# Patient Record
Sex: Female | Born: 1985 | Race: White | Hispanic: No | Marital: Single | Smoking: Never smoker
Health system: Southern US, Community
[De-identification: ages and names within clinical notes are randomized; demographics above are authoritative.]

---
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
## 2008-02-10 ENCOUNTER — Emergency Department (HOSPITAL_BASED_OUTPATIENT_CLINIC_OR_DEPARTMENT_OTHER): Admission: EM | Admit: 2008-02-10 | Discharge: 2008-02-10 | Payer: Self-pay | Admitting: Emergency Medicine

## 2008-02-17 ENCOUNTER — Ambulatory Visit (HOSPITAL_COMMUNITY): Admission: AD | Admit: 2008-02-17 | Discharge: 2008-02-17 | Payer: Self-pay | Admitting: Urology

## 2008-04-04 ENCOUNTER — Emergency Department (HOSPITAL_BASED_OUTPATIENT_CLINIC_OR_DEPARTMENT_OTHER): Admission: EM | Admit: 2008-04-04 | Discharge: 2008-04-04 | Payer: Self-pay | Admitting: Emergency Medicine

## 2009-03-28 IMAGING — CT CT ABDOMEN W/O CM
2 of 4 series · 16 of 46 positions shown, 18 images · non-contrast
Comparison: None

CT ABDOMEN

CLINICAL DATA: Flank pain.  Nausea.  Vomiting.  Assess for stone
or infection

CT ABDOMEN AND PELVIS WITHOUT CONTRAST
TECHNIQUE: Multidetector CT imaging of the abdomen and pelvis was
performed following the standard protocol without intravenous
contrast.

[Series 2: renal stone < 200 lbs 5.0 b31f · axial · 0.66mm/px · z∈[-420,-50]mm · 13 of 85 slices shown, 15 images]
[im 7/85  soft-tissue]
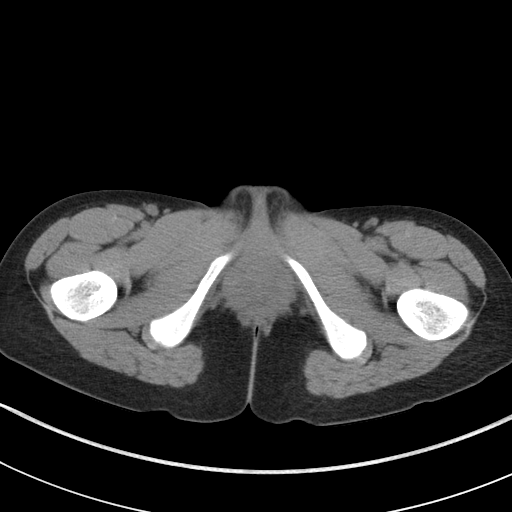
[im 7/85  bone]
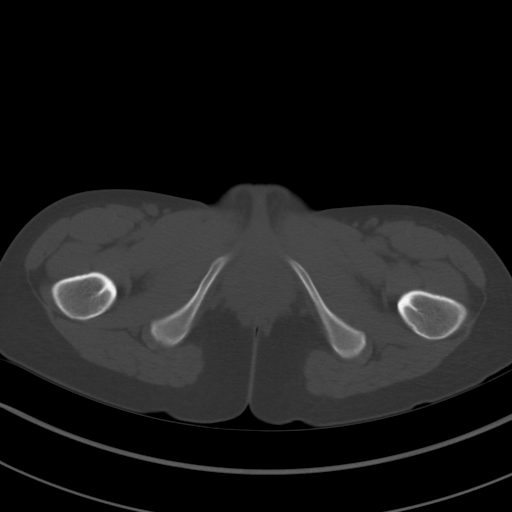
[im 13/85  soft-tissue]
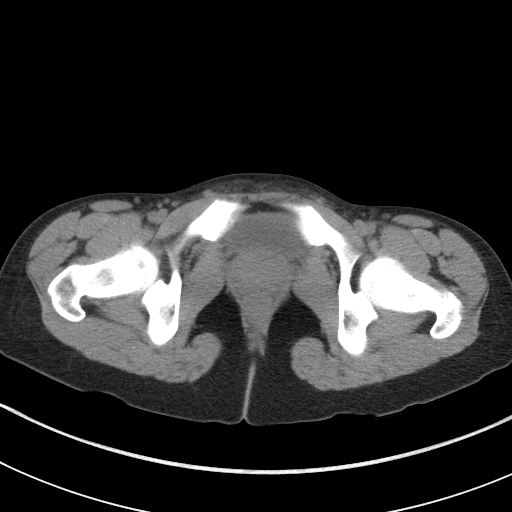
[im 19/85  soft-tissue]
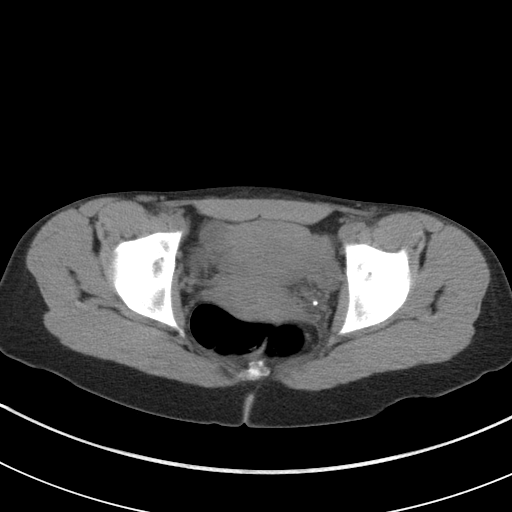
[im 25/85  soft-tissue]
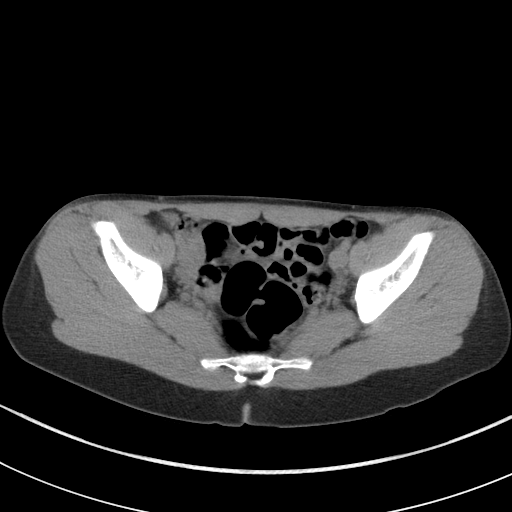
[im 32/85  soft-tissue]
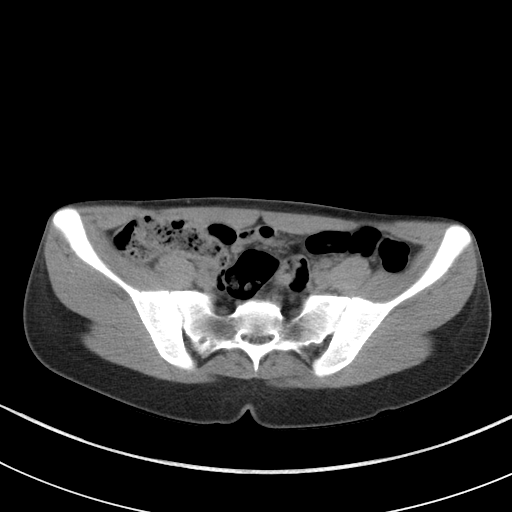
[im 38/85  soft-tissue]
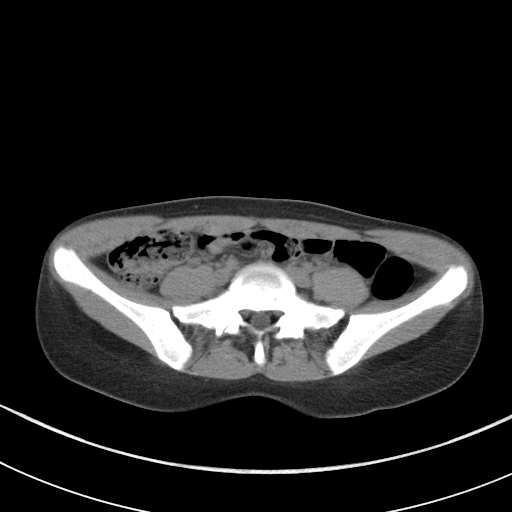
[im 44/85  soft-tissue]
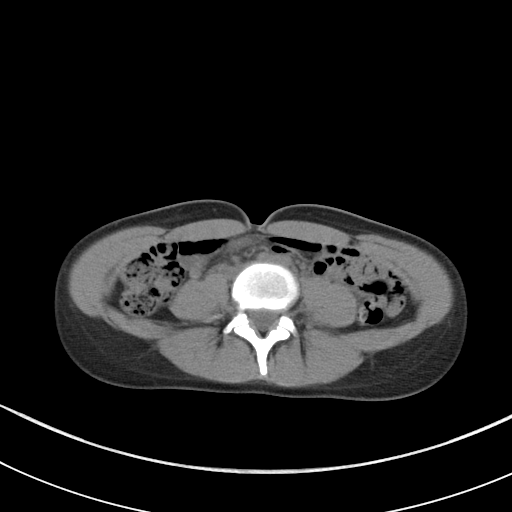
[im 50/85  soft-tissue]
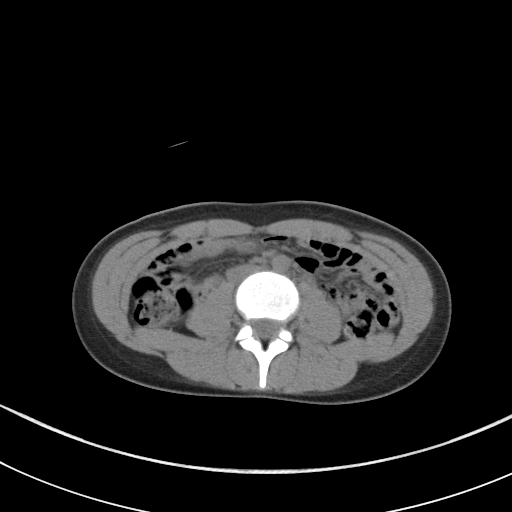
[im 57/85  soft-tissue]
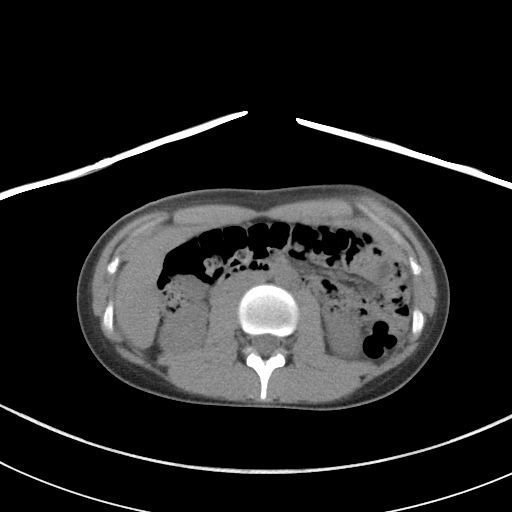
[im 57/85  bone]
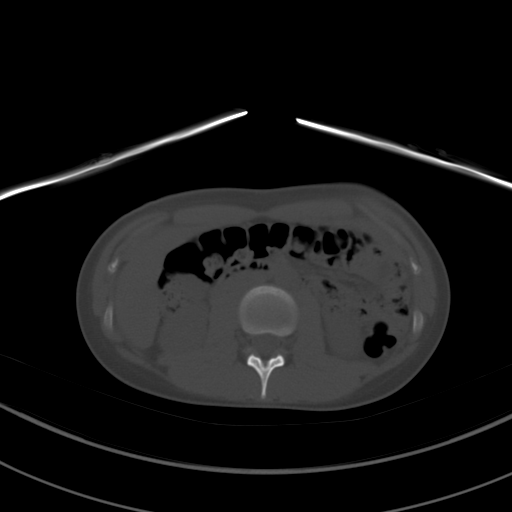
[im 63/85  soft-tissue]
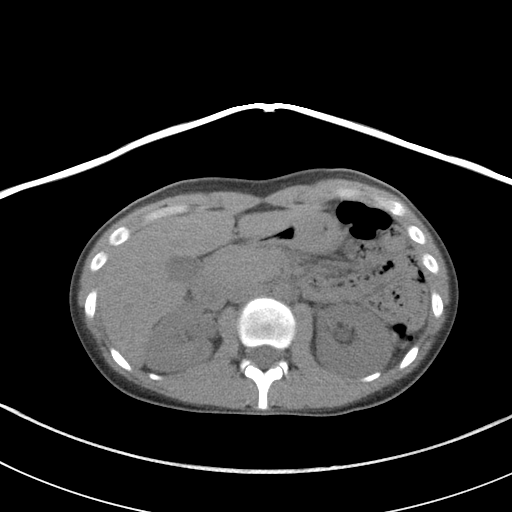
[im 69/85  soft-tissue]
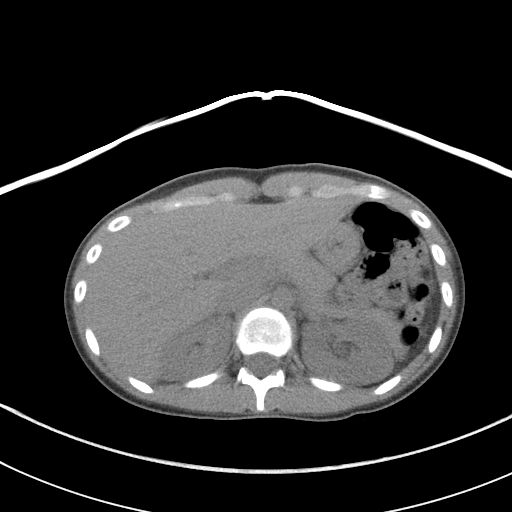
[im 75/85  soft-tissue]
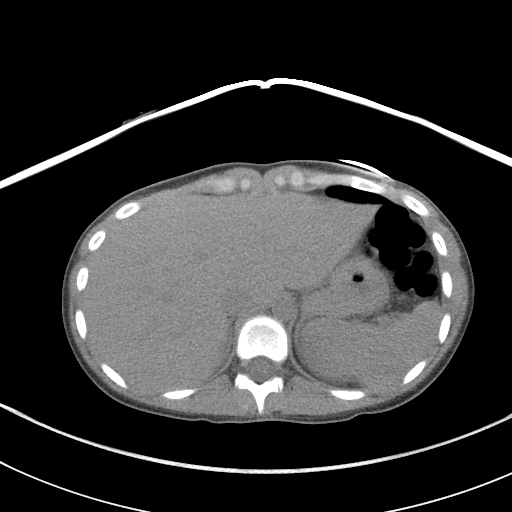
[im 81/85  soft-tissue]
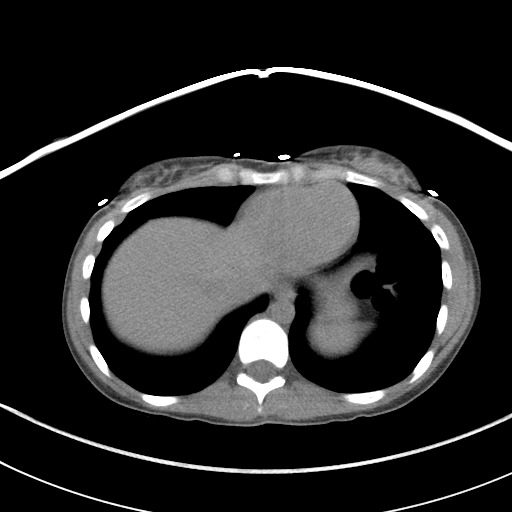

[Series 3: renal stone 2.0 coronal · coronal · 0.61mm/px · 3 of 77 slices shown]
[im 26/77  soft-tissue]
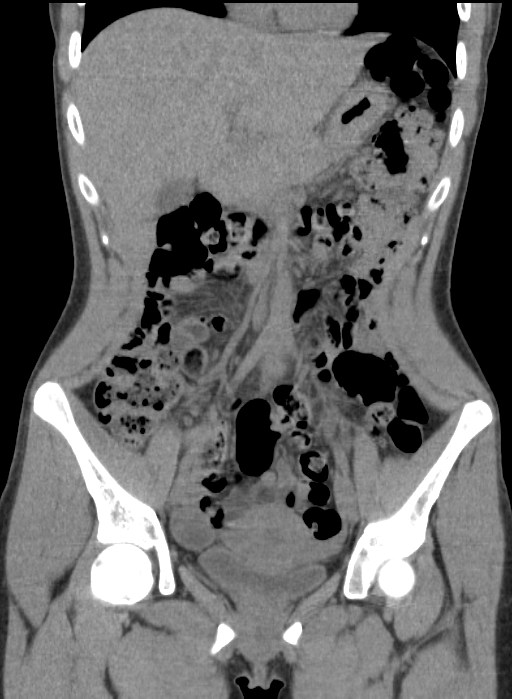
[im 34/77  soft-tissue]
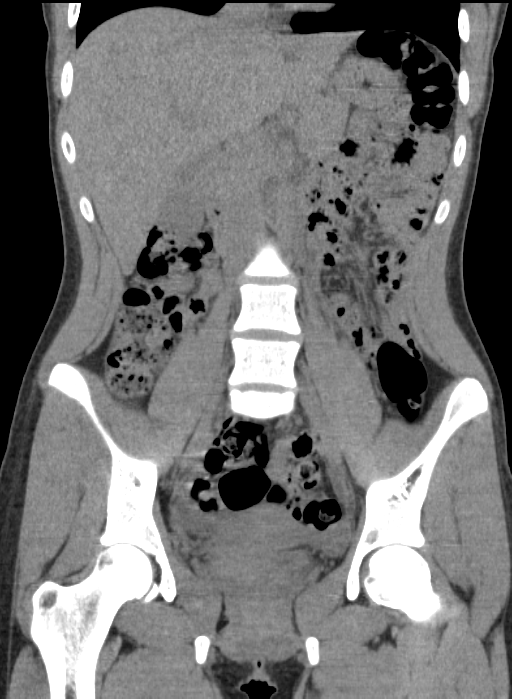
[im 43/77  soft-tissue]
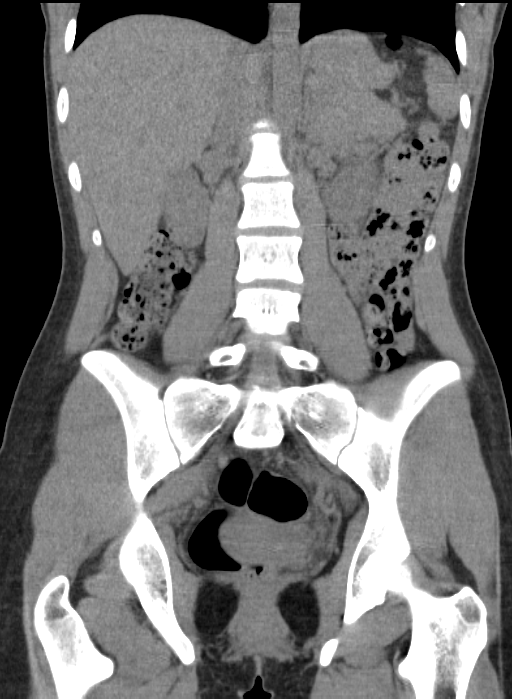

[16 of 46 positions shown; findings below may reference images not displayed]

FINDINGS: Lung bases are clear.  The liver, gallbladder, spleen,
pancreas and adrenal glands appear normal without contrast.  No
free fluid or air.  No bowel pathology is seen.  Right kidney is
normal in size shape and position.  No hydronephrosis.  Left kidney
is slightly swollen compared to the right to and shows mild
hydroureteronephrosis.  No obstructing lesion is not seen in the
abdominal portion of the scan.
IMPRESSION: Slight swelling of the left kidney and mild hydroureteronephrosis
without obstructing lesion being evident in the abdominal portion
of the scan.

CT PELVIS
FINDINGS: The uterus and adnexal regions appear normal.  No free
fluid.  There is a 4 mm calcification in the left pelvis that I
think probably represents a distal ureteral stone.  It is
conceivable this could represent a phlebolith but I think the
former is more likely.  No other stone.
IMPRESSION: 4mm calcification in the left pelvis suspicious for distal ureteral
stone.  I cannot be absolutely certain of this.  It is possible
this could be a phlebolith.  Location would be two centimeters
proximal to the UVJ.

## 2010-08-26 NOTE — Op Note (Signed)
NAMESHAENA, Joy Blair             ACCOUNT NO.:  [id number]   MEDICAL RECORD NO.:  [id number]          PATIENT TYPE:  AMB   LOCATION:  DAY                          FACILITY:  [facility]   PHYSICIAN:  Excell Seltzer. Annabell Howells, M.D.    DATE OF BIRTH:  [birth year]   DATE OF PROCEDURE:  02/17/2008  DATE OF DISCHARGE:                               OPERATIVE REPORT   PROCEDURE:  Cystoscopy with stone manipulation.   PREOPERATIVE DIAGNOSIS:  Left distal ureteral stone.   POSTOPERATIVE DIAGNOSIS:  Left distal ureteral stone.   SURGEON:  Bjorn Pippin, MD   ANESTHESIA:  General.   SPECIMENS:  Stone.   COMPLICATIONS:  None.   INDICATIONS:  Joy Blair is a 25 year old white female with a 3-4 mm left  distal ureteral stone who is having persistent symptoms and elected to  proceed with ureteroscopic therapy.   FINDINGS AND PROCEDURE:  The patient was taken to the operating room,  Cipro was given, general anesthetic was induced.  She was placed in  lithotomy position.  Her perineum and genitalia were prepped with  Betadine solution.  She was draped in the usual sterile fashion.  A  formal time-out was completed.  Cystoscopy was performed with a 22  [place] scope and 12 degree lens.  Examination revealed a normal urethra.  The bladder wall was smooth and pale without tumor or inflammation.  The  right ureteral orifice was unremarkable.  At the left ureteral orifice  the stone was crowning.   The stone was then manipulated with the beak of the cystoscope into the  bladder without difficulty and then removed from the bladder.   The 6 short ureteroscope was passed easily up the distal 4 cm of ureter  to make sure no other stones or abnormalities were noted and none were  seen.   The bladder was drained.  The patient was taken down from lithotomy  position.  Her anesthetic was reversed.  She was removed to recovery in  stable condition.  There were no complications.     Excell Seltzer. Annabell Howells, M.D.  Electronically  Signed    JJW/MEDQ  D:  02/17/2008  T:  02/18/2008  Job:  [id number]

## 2011-01-13 LAB — URINALYSIS, ROUTINE W REFLEX MICROSCOPIC
Glucose, UA: NEGATIVE
Ketones, ur: 15 — AB
Ketones, ur: NEGATIVE
Nitrite: NEGATIVE
Protein, ur: NEGATIVE
Specific Gravity, Urine: 1.023
Specific Gravity, Urine: 1.029
Urobilinogen, UA: 0.2
pH: 6
pH: 6.5

## 2011-01-13 LAB — DIFFERENTIAL
Eosinophils Relative: 1
Lymphocytes Relative: 31
Monocytes Absolute: 0.6
Monocytes Relative: 7
Neutrophils Relative %: 61

## 2011-01-13 LAB — BASIC METABOLIC PANEL
BUN: 12
Calcium: 10
GFR calc non Af Amer: >60
Potassium: 4.3

## 2011-01-13 LAB — ACETAMINOPHEN LEVEL: Acetaminophen (Tylenol), Serum: <10 — ABNORMAL LOW

## 2011-01-13 LAB — CBC
MCV: 83
RBC: 4.86
RDW: 13.2

## 2011-01-13 LAB — URINE MICROSCOPIC-ADD ON

## 2011-01-13 LAB — PREGNANCY, URINE: Preg Test, Ur: NEGATIVE

## 2011-01-13 LAB — URINE CULTURE

## 2012-09-09 DIAGNOSIS — R799 Abnormal finding of blood chemistry, unspecified: Secondary | ICD-10-CM | POA: Insufficient documentation

## 2013-12-11 ENCOUNTER — Emergency Department (HOSPITAL_BASED_OUTPATIENT_CLINIC_OR_DEPARTMENT_OTHER)
Admission: EM | Admit: 2013-12-11 | Discharge: 2013-12-11 | Disposition: A | Discharge status: Home / Self Care | Payer: BC Managed Care – PPO | Attending: Emergency Medicine | Admitting: Emergency Medicine

## 2013-12-11 ENCOUNTER — Encounter (HOSPITAL_BASED_OUTPATIENT_CLINIC_OR_DEPARTMENT_OTHER): Payer: Self-pay | Admitting: Emergency Medicine

## 2013-12-11 DIAGNOSIS — R109 Unspecified abdominal pain: Secondary | ICD-10-CM | POA: Diagnosis not present

## 2013-12-11 DIAGNOSIS — Z3202 Encounter for pregnancy test, result negative: Secondary | ICD-10-CM | POA: Insufficient documentation

## 2013-12-11 DIAGNOSIS — R110 Nausea: Secondary | ICD-10-CM | POA: Diagnosis not present

## 2013-12-11 LAB — URINALYSIS, ROUTINE W REFLEX MICROSCOPIC
BILIRUBIN URINE: NEGATIVE
GLUCOSE, UA: NEGATIVE mg/dL
KETONES UR: NEGATIVE mg/dL
Leukocytes, UA: NEGATIVE
Nitrite: NEGATIVE
PROTEIN: NEGATIVE mg/dL
SPECIFIC GRAVITY, URINE: 1.019 (ref 1.005–1.030)
Urobilinogen, UA: 0.2 mg/dL (ref 0.0–1.0)
pH: 5.5 (ref 5.0–8.0)

## 2013-12-11 LAB — URINE MICROSCOPIC-ADD ON

## 2013-12-11 LAB — PREGNANCY, URINE: PREG TEST UR: NEGATIVE

## 2013-12-11 MED ORDER — ONDANSETRON HCL 4 MG PO TABS: 4.0000 mg | ORAL_TABLET | Freq: Four times a day (QID) | ORAL | Status: DC

## 2013-12-11 MED ORDER — HYDROCODONE-ACETAMINOPHEN 5-325 MG PO TABS: 1.0000 | ORAL_TABLET | ORAL | Status: DC | PRN

## 2013-12-11 NOTE — ED Notes (Signed)
Right flank pain on and off since this am. Hx of kidney stones.

## 2013-12-11 NOTE — ED Provider Notes (Signed)
CSN: [id number]     Arrival date & time 12/11/13  1535 History   First MD Initiated Contact with Patient 12/11/13 1601     Chief Complaint  Patient presents with  . Flank Pain     (Consider location/radiation/quality/duration/timing/severity/associated sxs/prior Treatment) Patient is a 28 y.o. female presenting with flank pain. The history is provided by the patient. No language interpreter was used.  Flank Pain This is a new problem. The current episode started today. Associated symptoms include nausea. Pertinent negatives include no abdominal pain, chills, fever or vomiting. Associated symptoms comments: Right flank pain since this morning causing nausea without vomiting. No fever. She reports that since arrival here her pain has completely resolved. She has had kidney stones once in the past that required intervention. No fever, dysuria, hematuria. No abdominal pain.Marland Kitchen    History reviewed. No pertinent past medical history. History reviewed. No pertinent past surgical history. No family history on file. History  Substance Use Topics  . Smoking status: Never Smoker   . Smokeless tobacco: Not on file  . Alcohol Use: No   OB History   Grav Para Term Preterm Abortions TAB SAB Ect Mult Living                 Review of Systems  Constitutional: Negative for fever and chills.  Respiratory: Negative.   Cardiovascular: Negative.   Gastrointestinal: Positive for nausea. Negative for vomiting and abdominal pain.  Genitourinary: Positive for flank pain. Negative for dysuria, hematuria and vaginal discharge.  Musculoskeletal: Negative.   Neurological: Negative.       Allergies  Review of patient's allergies indicates no known allergies.  Home Medications   Prior to Admission medications   Not on File   BP 130/88  Pulse 99  Temp(Src) 97.7 F (36.5 C) (Oral)  Resp 16  Ht  (1.6 m)  Wt 140 lb (63.504 kg)  BMI 24.81 kg/m2  SpO2 100%  LMP 12/06/2013 Physical Exam   Constitutional: She is oriented to person, place, and time. She appears well-developed and well-nourished.  Neck: Normal range of motion.  Pulmonary/Chest: Effort normal.  Abdominal: There is no tenderness.  Genitourinary:  No CVA tenderness.   Musculoskeletal: Normal range of motion.  Neurological: She is alert and oriented to person, place, and time.  Skin: Skin is warm and dry.  Psychiatric: She has a normal mood and affect.    ED Course  Procedures (including critical care time) Labs Review Labs Reviewed  URINALYSIS, ROUTINE W REFLEX MICROSCOPIC - Abnormal; Notable for the following:    Hgb urine dipstick LARGE (*)    All other components within normal limits  PREGNANCY, URINE  URINE MICROSCOPIC-ADD ON   Results for orders placed during the hospital encounter of 12/11/13  URINALYSIS, ROUTINE W REFLEX MICROSCOPIC      Result Value Ref Range   Color, Urine YELLOW  YELLOW   APPearance CLEAR  CLEAR   Specific Gravity, Urine 1.019  1.005 - 1.030   pH 5.5  5.0 - 8.0   Glucose, UA NEGATIVE  NEGATIVE mg/dL   Hgb urine dipstick LARGE (*) NEGATIVE   Bilirubin Urine NEGATIVE  NEGATIVE   Ketones, ur NEGATIVE  NEGATIVE mg/dL   Protein, ur NEGATIVE  NEGATIVE mg/dL   Urobilinogen, UA 0.2  0.0 - 1.0 mg/dL   Nitrite NEGATIVE  NEGATIVE   Leukocytes, UA NEGATIVE  NEGATIVE  PREGNANCY, URINE      Result Value Ref Range   Preg Test, Ur NEGATIVE  NEGATIVE  URINE MICROSCOPIC-ADD ON      Result Value Ref Range   Squamous Epithelial / LPF RARE  RARE   RBC / HPF 21-50  <3 RBC/hpf   Bacteria, UA RARE  RARE     Imaging Review No results found.   EKG Interpretation None      MDM   Final diagnoses:  None    1. Flank pain  No further pain in ED on re-evaluation. Suspect, with hematuria, history of stones and resolved pain that is it is likely she passed a kidney stone. Discussed return precautions with the patient. Pain and nausea medication Rx's provided which patient will fill  if she has recurrent symptoms.    Arnoldo Hooker, PA-C 12/11/13 [phone]

## 2013-12-11 NOTE — Discharge Instructions (Signed)
Flank Pain °Flank pain refers to pain that is located on the side of the body between the upper abdomen and the back. The pain may occur over a short period of time (acute) or may be long-term or reoccurring (chronic). It may be mild or severe. Flank pain can be caused by many things. °CAUSES  °Some of the more common causes of flank pain include: °· Muscle strains.   °· Muscle spasms.   °· A disease of your spine (vertebral disk disease).   °· A lung infection (pneumonia).   °· Fluid around your lungs (pulmonary edema).   °· A kidney infection.   °· Kidney stones.   °· A very painful skin rash caused by the chickenpox virus (shingles).   °· Gallbladder disease.   °HOME CARE INSTRUCTIONS  °Home care will depend on the cause of your pain. In general, °· Rest as directed by your caregiver. °· Drink enough fluids to keep your urine clear or pale yellow. °· Only take over-the-counter or prescription medicines as directed by your caregiver. Some medicines may help relieve the pain. °· Tell your caregiver about any changes in your pain. °· Follow up with your caregiver as directed. °SEEK IMMEDIATE MEDICAL CARE IF:  °· Your pain is not controlled with medicine.   °· You have new or worsening symptoms. °· Your pain increases.   °· You have abdominal pain.   °· You have shortness of breath.   °· You have persistent nausea or vomiting.   °· You have swelling in your abdomen.   °· You feel faint or pass out.   °· You have blood in your urine. °· You have a fever or persistent symptoms for more than 2-3 days. °· You have a fever and your symptoms suddenly get worse. °MAKE SURE YOU:  °· Understand these instructions. °· Will watch your condition. °· Will get help right away if you are not doing well or get worse. °Document Released: 05/21/2005 Document Revised: 12/23/2011 Document Reviewed: 11/12/2011 °ExitCare® Patient Information ©2015 [employer]. This information is not intended to replace advice given to you by your  health care provider. Make sure you discuss any questions you have with your health care provider. ° °

## 2013-12-13 NOTE — ED Provider Notes (Signed)
Medical screening examination/treatment/procedure(s) were performed by non-physician practitioner and as supervising physician I was immediately available for consultation/collaboration.    Kaimana Lurz L Gurshan Settlemire, MD 12/13/13 [phone] 

## 2014-11-07 ENCOUNTER — Encounter (HOSPITAL_BASED_OUTPATIENT_CLINIC_OR_DEPARTMENT_OTHER): Payer: Self-pay

## 2014-11-07 ENCOUNTER — Emergency Department (HOSPITAL_BASED_OUTPATIENT_CLINIC_OR_DEPARTMENT_OTHER)
Admission: EM | Admit: 2014-11-07 | Discharge: 2014-11-07 | Disposition: A | Discharge status: Home / Self Care | Payer: BLUE CROSS/BLUE SHIELD | Attending: Emergency Medicine | Admitting: Emergency Medicine

## 2014-11-07 ENCOUNTER — Emergency Department (HOSPITAL_BASED_OUTPATIENT_CLINIC_OR_DEPARTMENT_OTHER): Payer: BLUE CROSS/BLUE SHIELD

## 2014-11-07 DIAGNOSIS — Y9289 Other specified places as the place of occurrence of the external cause: Secondary | ICD-10-CM | POA: Diagnosis not present

## 2014-11-07 DIAGNOSIS — S93401A Sprain of unspecified ligament of right ankle, initial encounter: Secondary | ICD-10-CM | POA: Diagnosis not present

## 2014-11-07 DIAGNOSIS — X58XXXA Exposure to other specified factors, initial encounter: Secondary | ICD-10-CM | POA: Insufficient documentation

## 2014-11-07 DIAGNOSIS — Y998 Other external cause status: Secondary | ICD-10-CM | POA: Insufficient documentation

## 2014-11-07 DIAGNOSIS — Y93A1 Activity, exercise machines primarily for cardiorespiratory conditioning: Secondary | ICD-10-CM | POA: Insufficient documentation

## 2014-11-07 DIAGNOSIS — S99911A Unspecified injury of right ankle, initial encounter: Secondary | ICD-10-CM | POA: Diagnosis present

## 2014-11-07 NOTE — Discharge Instructions (Signed)
You may take ibuprofen or naproxen (NSAIDs) for pain and swelling.  Ankle Sprain An ankle sprain is an injury to the strong, fibrous tissues (ligaments) that hold the bones of your ankle joint together.  CAUSES An ankle sprain is usually caused by a fall or by twisting your ankle. Ankle sprains most commonly occur when you step on the outer edge of your foot, and your ankle turns inward. People who participate in sports are more prone to these types of injuries.  SYMPTOMS   Pain in your ankle. The pain may be present at rest or only when you are trying to stand or walk.  Swelling.  Bruising. Bruising may develop immediately or within 1 to 2 days after your injury.  Difficulty standing or walking, particularly when turning corners or changing directions. DIAGNOSIS  Your caregiver will ask you details about your injury and perform a physical exam of your ankle to determine if you have an ankle sprain. During the physical exam, your caregiver will press on and apply pressure to specific areas of your foot and ankle. Your caregiver will try to move your ankle in certain ways. An X-ray exam may be done to be sure a bone was not broken or a ligament did not separate from one of the bones in your ankle (avulsion fracture).  TREATMENT  Certain types of braces can help stabilize your ankle. Your caregiver can make a recommendation for this. Your caregiver may recommend the use of medicine for pain. If your sprain is severe, your caregiver may refer you to a surgeon who helps to restore function to parts of your skeletal system (orthopedist) or a physical therapist. HOME CARE INSTRUCTIONS   Apply ice to your injury for 1-2 days or as directed by your caregiver. Applying ice helps to reduce inflammation and pain.  Put ice in a plastic bag.  Place a towel between your skin and the bag.  Leave the ice on for 15-20 minutes at a time, every 2 hours while you are awake.  Only take over-the-counter or  prescription medicines for pain, discomfort, or fever as directed by your caregiver.  Elevate your injured ankle above the level of your heart as much as possible for 2-3 days.  If your caregiver recommends crutches, use them as instructed. Gradually put weight on the affected ankle. Continue to use crutches or a cane until you can walk without feeling pain in your ankle.  If you have a plaster splint, wear the splint as directed by your caregiver. Do not rest it on anything harder than a pillow for the first 24 hours. Do not put weight on it. Do not get it wet. You may take it off to take a shower or bath.  You may have been given an elastic bandage to wear around your ankle to provide support. If the elastic bandage is too tight (you have numbness or tingling in your foot or your foot becomes cold and blue), adjust the bandage to make it comfortable.  If you have an air splint, you may blow more air into it or let air out to make it more comfortable. You may take your splint off at night and before taking a shower or bath. Wiggle your toes in the splint several times per day to decrease swelling. SEEK MEDICAL CARE IF:   You have rapidly increasing bruising or swelling.  Your toes feel extremely cold or you lose feeling in your foot.  Your pain is not relieved with medicine.  SEEK IMMEDIATE MEDICAL CARE IF:  Your toes are numb or blue.  You have severe pain that is increasing. MAKE SURE YOU:   Understand these instructions.  Will watch your condition.  Will get help right away if you are not doing well or get worse. Document Released: 03/30/2005 Document Revised: 12/23/2011 Document Reviewed: 04/11/2011 [facility] Patient Information 2015 [place]ExitCare, [place]LLC. This information is not intended to replace advice given to you by your health care provider. Make sure you discuss any questions you have with your health care provider. RICE: Routine Care for Injuries The routine care of many  injuries includes Rest, Ice, Compression, and Elevation (RICE). HOME CARE INSTRUCTIONS  Rest is needed to allow your body to heal. Routine activities can usually be resumed when comfortable. Injured tendons and bones can take up to 6 weeks to heal. Tendons are the cord-like structures that attach muscle to bone.  Ice following an injury helps keep the swelling down and reduces pain.  Put ice in a plastic bag.  Place a towel between your skin and the bag.  Leave the ice on for 15-20 minutes, 3-4 times a day, or as directed by your health care provider. Do this while awake, for the first 24 to 48 hours. After that, continue as directed by your caregiver.  Compression helps keep swelling down. It also gives support and helps with discomfort. If an elastic bandage has been applied, it should be removed and reapplied every 3 to 4 hours. It should not be applied tightly, but firmly enough to keep swelling down. Watch fingers or toes for swelling, bluish discoloration, coldness, numbness, or excessive pain. If any of these problems occur, remove the bandage and reapply loosely. Contact your caregiver if these problems continue.  Elevation helps reduce swelling and decreases pain. With extremities, such as the arms, hands, legs, and feet, the injured area should be placed near or above the level of the heart, if possible. SEEK IMMEDIATE MEDICAL CARE IF:  You have persistent pain and swelling.  You develop redness, numbness, or unexpected weakness.  Your symptoms are getting worse rather than improving after several days. These symptoms may indicate that further evaluation or further X-rays are needed. Sometimes, X-rays may not show a small broken bone (fracture) until 1 week or 10 days later. Make a follow-up appointment with your caregiver. Ask when your X-ray results will be ready. Make sure you get your X-ray results. Document Released: 07/12/2000 Document Revised: 04/04/2013 Document Reviewed:  08/29/2010 [facility] ClearforkExitCare Patient Information 2015 [place]ExitCare, [place]LLC. This information is not intended to replace advice given to you by your health care provider. Make sure you discuss any questions you have with your health care provider.

## 2014-11-07 NOTE — ED Notes (Signed)
Right ankle injury today

## 2014-11-07 NOTE — ED Provider Notes (Signed)
CSN: [id number]     Arrival date & time 11/07/14  1351 History   First MD Initiated Contact with Patient 11/07/14 1352     Chief Complaint  Patient presents with  . Ankle Injury     (Consider location/radiation/quality/duration/timing/severity/associated sxs/prior Treatment) HPI Comments: 29 y/o F c/o R ankle pain after stepping off the treadmill this morning and twisting her ankle. states she heard a "pop". Reports immediate pain rated 7/10 and states she cannot walk to due pain. Denies numbness.  Patient is a 29 y.o. female presenting with lower extremity injury. The history is provided by the patient.  Ankle Injury This is a new problem. The current episode started today. The problem occurs constantly. The problem has been unchanged. Pertinent negatives include no fever or numbness. The symptoms are aggravated by walking. She has tried nothing for the symptoms.    History reviewed. No pertinent past medical history. History reviewed. No pertinent past surgical history. No family history on file. History  Substance Use Topics  . Smoking status: Never Smoker   . Smokeless tobacco: Not on file  . Alcohol Use: No   OB History    No data available     Review of Systems  Constitutional: Negative for fever.  Musculoskeletal:       + R ankle pain/swelling.  Skin: Negative for color change.  Neurological: Negative for numbness.      Allergies  Other  Home Medications   Prior to Admission medications   Not on File   BP 120/71 mmHg  Pulse 95  Temp(Src) 98.5 F (36.9 C) (Oral)  Resp 16  Ht 5\' 3"  (1.6 m)  Wt 135 lb (61.236 kg)  BMI 23.92 kg/m2  SpO2 100%  LMP 11/07/2014 Physical Exam  Constitutional: She is oriented to person, place, and time. She appears well-developed and well-nourished. No distress.  HENT:  Head: Normocephalic and atraumatic.  Mouth/Throat: Oropharynx is clear and moist.  Eyes: Conjunctivae and EOM are normal.  Neck: Normal range of motion.  Neck supple.  Cardiovascular: Normal rate, regular rhythm and normal heart sounds.   Pulmonary/Chest: Effort normal and breath sounds normal. No respiratory distress.  Musculoskeletal:  R ankle TTP over lateral malleolus and AITFL with mild swelling. ROM limited due to pain. Achilles tendon intact. +2 PT/DP pulse. Able to wiggle toes.  Neurological: She is alert and oriented to person, place, and time. No sensory deficit.  Skin: Skin is warm and dry.  Psychiatric: She has a normal mood and affect. Her behavior is normal.  Nursing note and vitals reviewed.   ED Course  Procedures (including critical care time) Labs Review Labs Reviewed - No data to display  Imaging Review Dg Ankle Complete Right  11/07/2014   CLINICAL DATA:  Fall this afternoon, pain lateral and anterior right ankle. Unable to bear weight.  EXAM: RIGHT ANKLE - COMPLETE 3+ VIEW  COMPARISON:  None.  FINDINGS: Osseous alignment is normal. Bone mineralization is normal. No fracture line or displaced fracture fragment identified. Ankle mortise is symmetric and the talar dome appears intact. Visualized portions of the hindfoot and midfoot appear intact and well aligned. Soft tissue swelling noted, most prominent over the lateral malleolus.  IMPRESSION: Soft tissue swelling.  No osseous fracture or dislocation.   Electronically Signed   By: Bary Richard M.D.   On: 11/07/2014 14:15     EKG Interpretation None      MDM   Final diagnoses:  Right ankle sprain, initial encounter  Neurovascularly intact. Xray negative. Advised RICE and NSAIDs. Stable for d/c. F/u with ortho if no improvement in 1-2 weeks. Return precautions given. Patient states understanding of treatment care plan and is agreeable.  Kathrynn Speed, PA-C 11/07/14 1425  Gwyneth Sprout, MD 11/08/14 [phone]

## 2014-11-16 ENCOUNTER — Ambulatory Visit (INDEPENDENT_AMBULATORY_CARE_PROVIDER_SITE_OTHER): Payer: BLUE CROSS/BLUE SHIELD | Admitting: Family Medicine

## 2014-11-16 ENCOUNTER — Encounter: Payer: Self-pay | Admitting: Family Medicine

## 2014-11-16 VITALS — BP 120/80 | HR 91 | Ht 64.0 in | Wt 135.0 lb

## 2014-11-16 DIAGNOSIS — S93401A Sprain of unspecified ligament of right ankle, initial encounter: Secondary | ICD-10-CM | POA: Diagnosis not present

## 2014-11-16 NOTE — Progress Notes (Signed)
PCP: No PCP Per Patient  Subjective:   HPI: Patient is a 29 y.o. female here for right ankle injury.  Patient reports on 7/27 she accidentally inverted right ankle coming off a treadmill. Immediate pain, swelling laterally. Couldn't bear weight initially. Radiographs in ED negative for fracture. Used crutches, elevated, iced area, initially used a boot but now using an ACE wrap.Marland Kitchen No prior injuries. Continued swelling.  No past medical history on file.  No current outpatient prescriptions on file prior to visit.   No current facility-administered medications on file prior to visit.    No past surgical history on file.  Allergies  Allergen Reactions  . Other Nausea And Vomiting    "almost all pain meds except tylenol"    History   Social History  . Marital Status: Single    Spouse Name: N/A  . Number of Children: N/A  . Years of Education: N/A   Occupational History  . Not on file.   Social History Main Topics  . Smoking status: Never Smoker   . Smokeless tobacco: Not on file  . Alcohol Use: No  . Drug Use: No  . Sexual Activity: Yes    Birth Control/ Protection: None   Other Topics Concern  . Not on file   Social History Narrative    No family history on file.  BP 120/80 mmHg  Pulse 91  Ht  (1.626 m)  Wt 135 lb (61.236 kg)  BMI 23.16 kg/m2  LMP 11/07/2014  Review of Systems: See HPI above.    Objective:  Physical Exam:  Gen: NAD  Right ankle/foot: Mild lateral swelling.  No bruising, other deformity. Mod limitation ROM all directions. TTP over ATFL, less lateral malleolus. 2+ ant drawer and negative talar tilt.   Negative syndesmotic compression. Thompsons test negative. NV intact distally.  MSK u/s:  Peroneal tendons intact.  No evidence lateral malleolar or talus fracture    Assessment & Plan:  1. Right ankle sprain - At least grade 2.  Icing, elevation, nsaids as needed.  ASO for stability.  SHown home exercises to do daily.   F/u in 2 weeks.

## 2014-11-16 NOTE — Assessment & Plan Note (Signed)
At least grade 2.  Icing, elevation, nsaids as needed.  ASO for stability.  SHown home exercises to do daily.  F/u in 2 weeks.

## 2014-11-16 NOTE — Patient Instructions (Signed)
You have an ankle sprain. Ice the area for 15 minutes at a time, 3-4 times a day Aleve 2 tabs twice a day with food OR ibuprofen 3 tabs three times a day with food for pain and inflammation. Elevate above the level of your heart when possible Use laceup ankle brace to help with stability while you recover from this injury - use when up and walking around for another 4 weeks. Come out of the boot/brace twice a day to do Up/down and alphabet exercises 2-3 sets of each. Start theraband strengthening exercises in about a week - once a day 3 sets of 10. Consider physical therapy for strengthening and balance exercises in future. If not improving as expected, we may repeat x-rays or consider further testing like an MRI. Follow up with me in 2 weeks.

## 2014-11-30 ENCOUNTER — Ambulatory Visit: Payer: BLUE CROSS/BLUE SHIELD | Admitting: Family Medicine

## 2015-12-24 IMAGING — CR DG ANKLE COMPLETE 3+V*R*
3 series · 3 of 3 positions shown · non-contrast
Comparison: None.

CLINICAL DATA: Fall this afternoon, pain lateral and anterior right
ankle. Unable to bear weight.

EXAM:
RIGHT ANKLE - COMPLETE 3+ VIEW

[t ankle joint ap right]
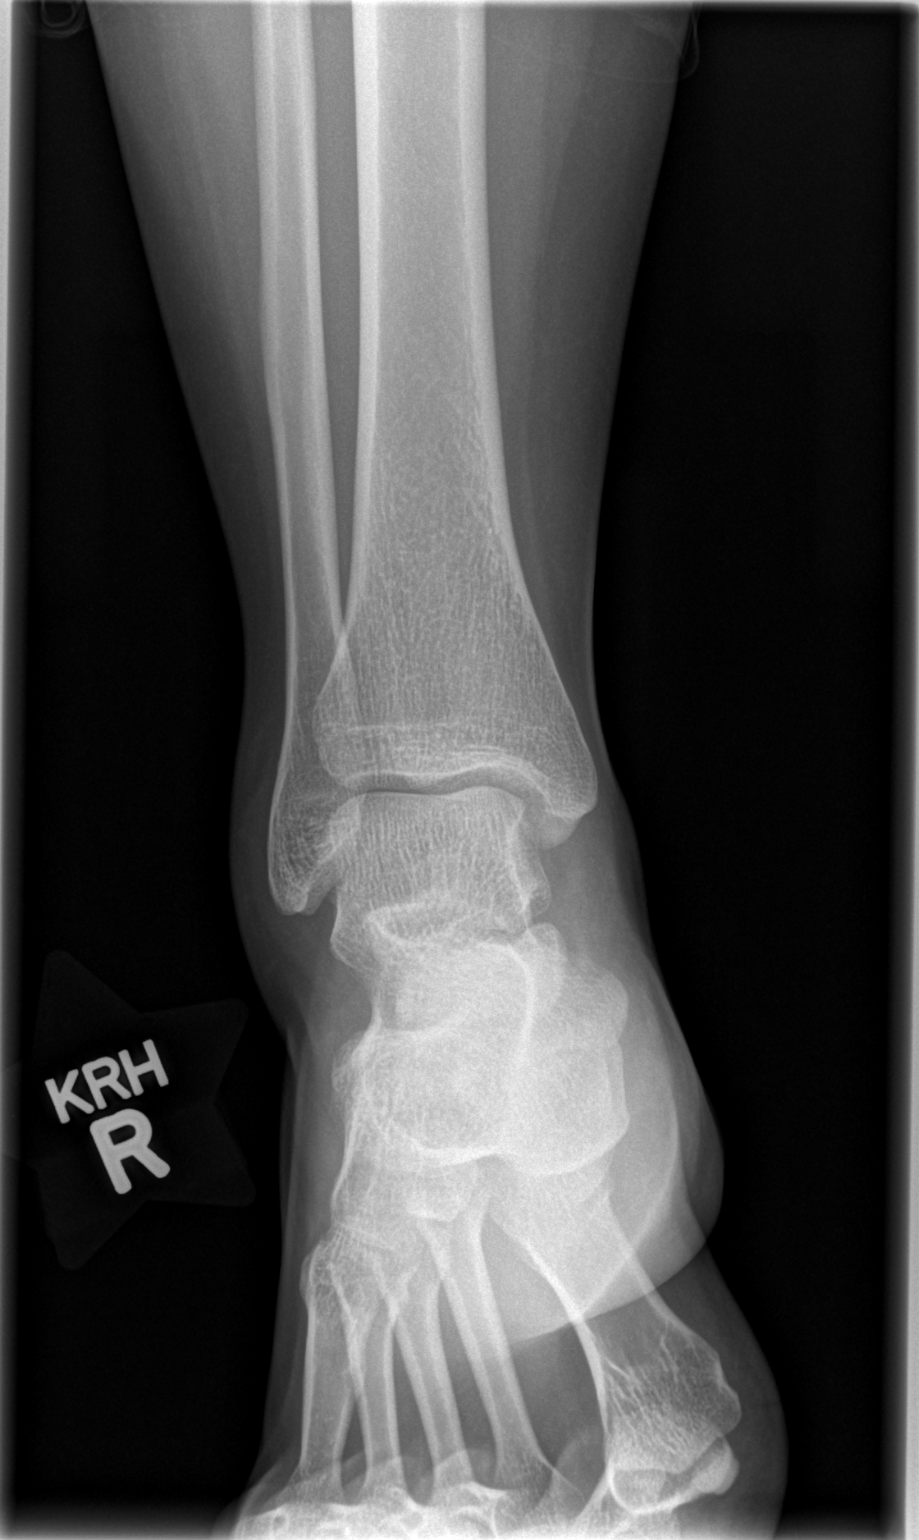

[t ankle joint oblique right]
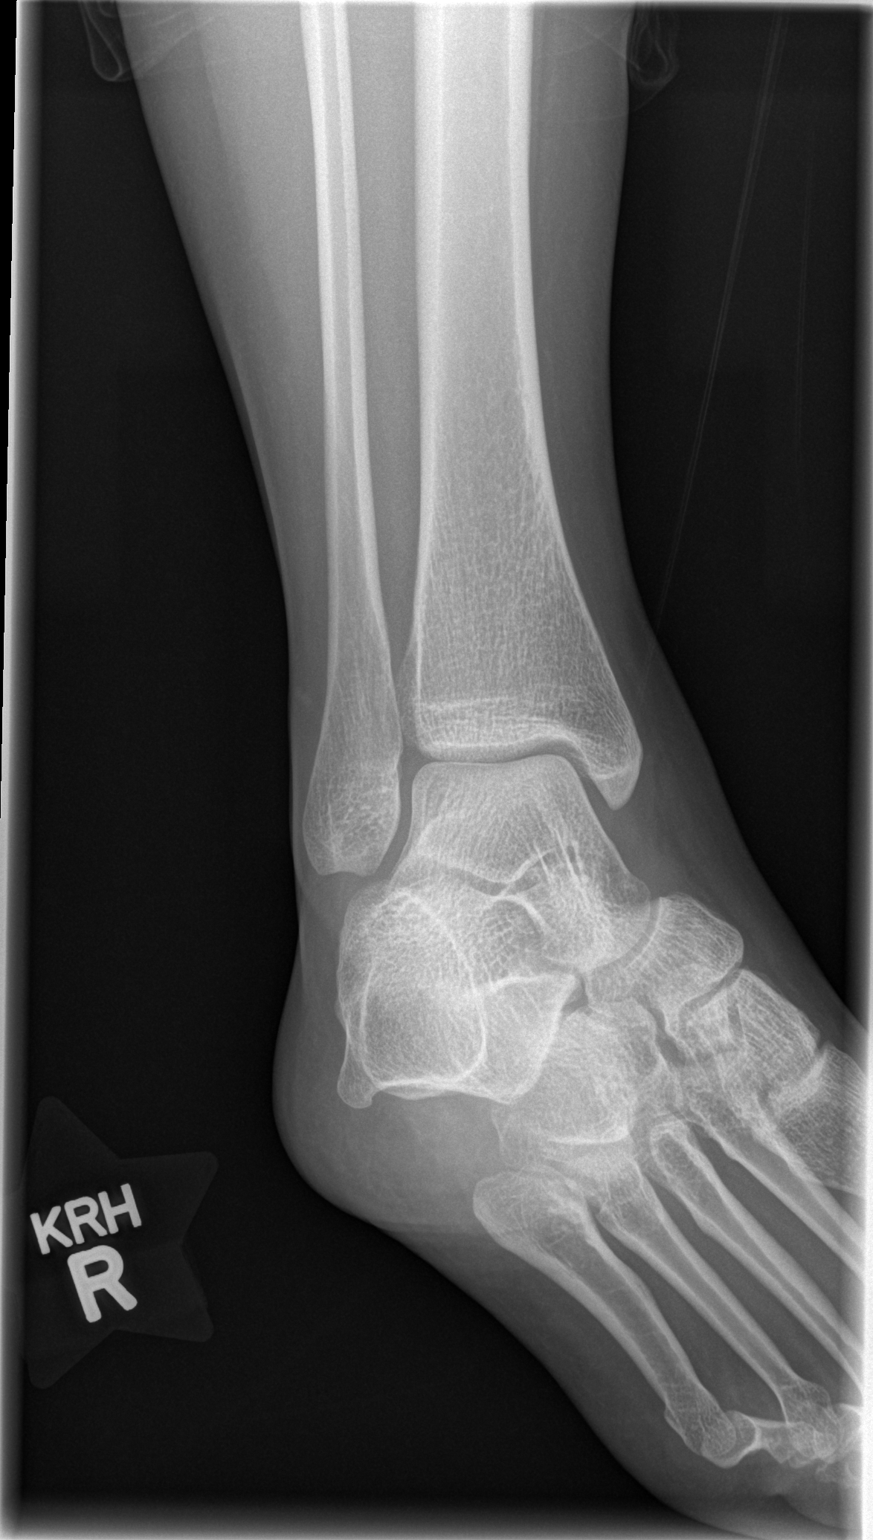

[t ankle joint lat right]
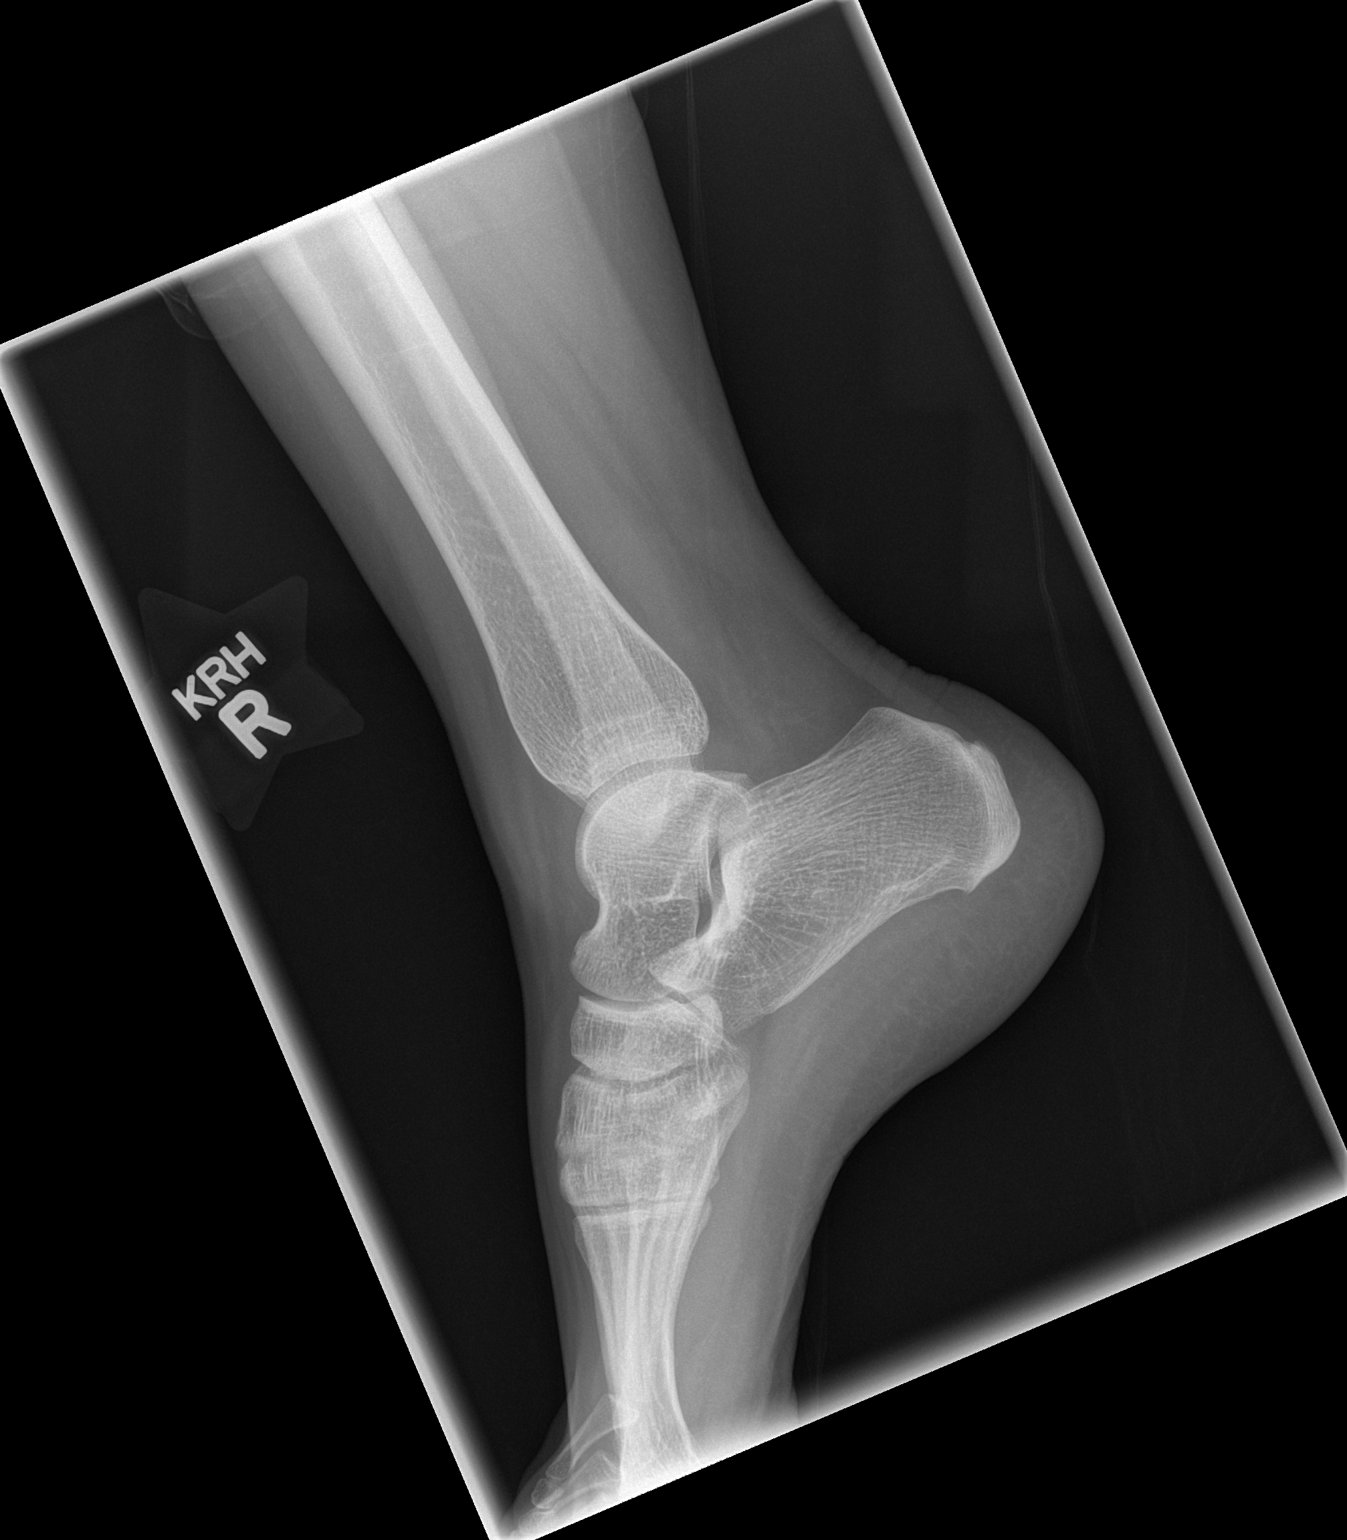

[3 of 3 positions shown; findings below may reference images not displayed]

FINDINGS: Osseous alignment is normal. Bone mineralization is normal. No
fracture line or displaced fracture fragment identified. Ankle
mortise is symmetric and the talar dome appears intact. Visualized
portions of the hindfoot and midfoot appear intact and well aligned.
Soft tissue swelling noted, most prominent over the lateral
malleolus.
IMPRESSION: Soft tissue swelling.  No osseous fracture or dislocation.
